# Patient Record
Sex: Male | Born: 1990 | Race: White | Hispanic: No | Marital: Single
Health system: Southern US, Community
[De-identification: ages and names within clinical notes are randomized; demographics above are authoritative.]

---
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
## 2010-10-26 IMAGING — CR DG CHEST 2V
2 series · 2 of 2 positions shown · non-contrast
Comparison: None.

CLINICAL DATA: Cough and sore throat.

CHEST - 2 VIEW

[view not recorded (1 of 2)]
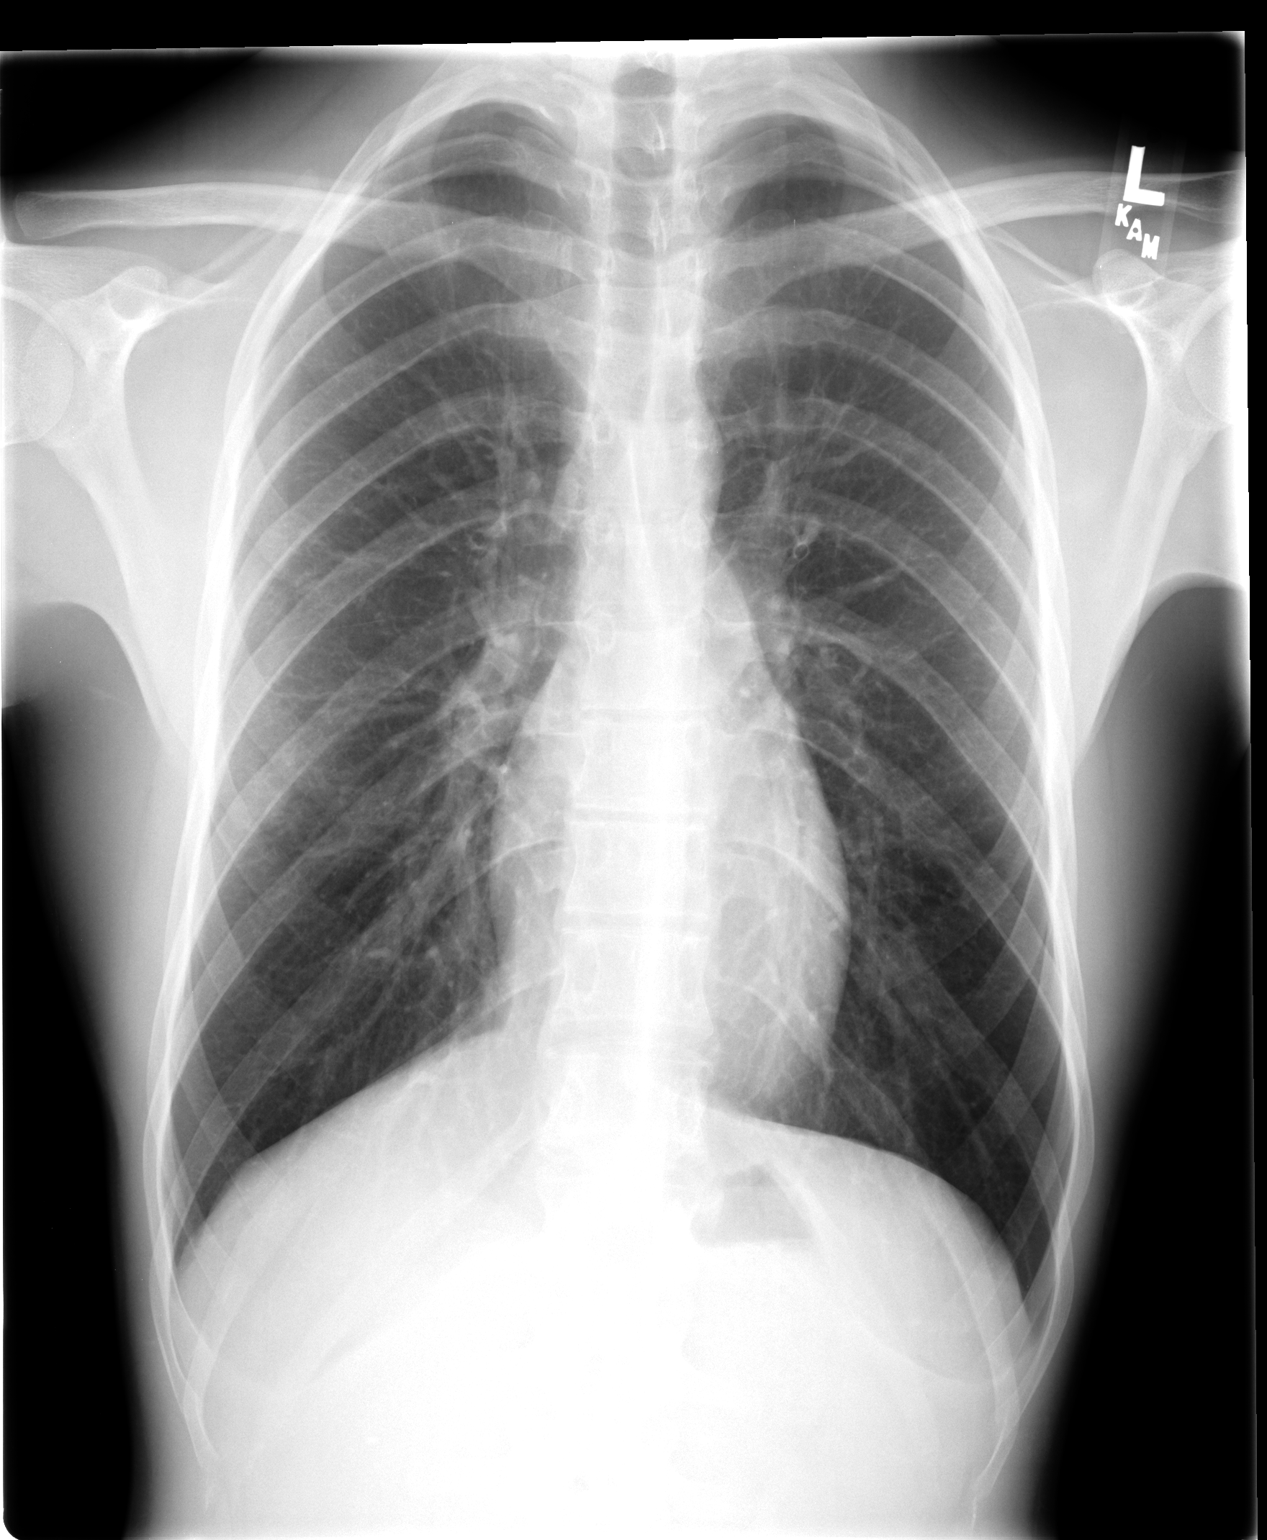

[view not recorded (2 of 2)]
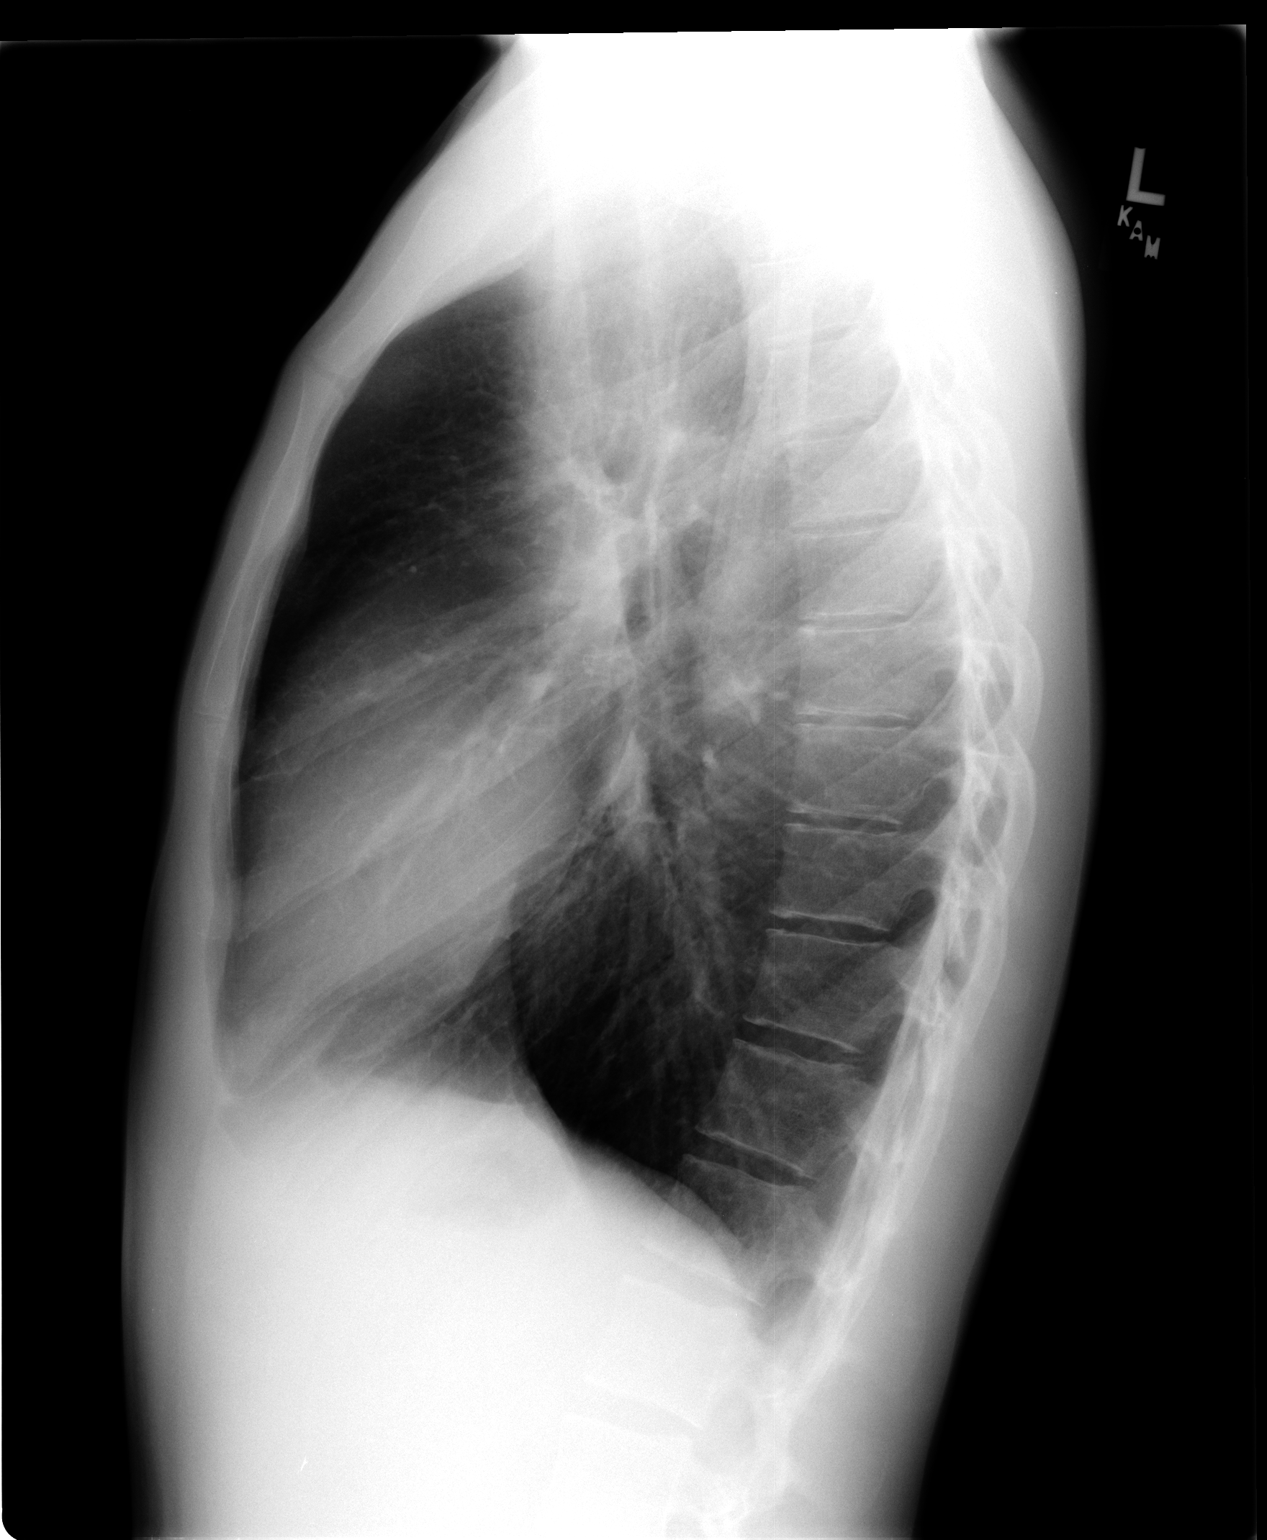

[2 of 2 positions shown; findings below may reference images not displayed]

FINDINGS: Trachea is midline.  Heart size normal.  Lungs are clear
but mildly hyperexpanded.  No pleural fluid.
IMPRESSION: No acute findings.

## 2019-03-09 ENCOUNTER — Ambulatory Visit: Payer: Self-pay | Attending: Internal Medicine

## 2019-03-09 DIAGNOSIS — Z20822 Contact with and (suspected) exposure to covid-19: Secondary | ICD-10-CM | POA: Insufficient documentation

## 2019-03-11 LAB — NOVEL CORONAVIRUS, NAA: SARS-CoV-2, NAA: NOT DETECTED

## 2021-12-21 ENCOUNTER — Other Ambulatory Visit: Payer: Self-pay

## 2021-12-21 ENCOUNTER — Emergency Department (HOSPITAL_COMMUNITY)
Admission: EM | Admit: 2021-12-21 | Discharge: 2021-12-22 | Disposition: A | Discharge status: Home / Self Care | Payer: BC Managed Care – PPO | Attending: Emergency Medicine | Admitting: Emergency Medicine

## 2021-12-21 DIAGNOSIS — F329 Major depressive disorder, single episode, unspecified: Secondary | ICD-10-CM | POA: Insufficient documentation

## 2021-12-21 DIAGNOSIS — F12959 Cannabis use, unspecified with psychotic disorder, unspecified: Secondary | ICD-10-CM | POA: Insufficient documentation

## 2021-12-21 DIAGNOSIS — I10 Essential (primary) hypertension: Secondary | ICD-10-CM | POA: Diagnosis not present

## 2021-12-21 DIAGNOSIS — R000 Tachycardia, unspecified: Secondary | ICD-10-CM | POA: Insufficient documentation

## 2021-12-21 DIAGNOSIS — Y900 Blood alcohol level of less than 20 mg/100 ml: Secondary | ICD-10-CM | POA: Insufficient documentation

## 2021-12-21 DIAGNOSIS — T50991A Poisoning by other drugs, medicaments and biological substances, accidental (unintentional), initial encounter: Secondary | ICD-10-CM | POA: Insufficient documentation

## 2021-12-21 DIAGNOSIS — T6591XA Toxic effect of unspecified substance, accidental (unintentional), initial encounter: Secondary | ICD-10-CM

## 2021-12-21 DIAGNOSIS — F419 Anxiety disorder, unspecified: Secondary | ICD-10-CM | POA: Insufficient documentation

## 2021-12-21 DIAGNOSIS — R45851 Suicidal ideations: Secondary | ICD-10-CM | POA: Insufficient documentation

## 2021-12-21 DIAGNOSIS — Z046 Encounter for general psychiatric examination, requested by authority: Secondary | ICD-10-CM | POA: Diagnosis present

## 2021-12-21 LAB — BASIC METABOLIC PANEL
Anion gap: 13 (ref 5–15)
BUN: 15 mg/dL (ref 6–20)
CO2: 22 mmol/L (ref 22–32)
Calcium: 9.4 mg/dL (ref 8.9–10.3)
Chloride: 103 mmol/L (ref 98–111)
Creatinine, Ser: 1.17 mg/dL (ref 0.61–1.24)
GFR, Estimated: >60 mL/min (ref >60)
Glucose, Bld: 134 mg/dL — ABNORMAL HIGH (ref 70–99)
Potassium: 3.4 mmol/L — ABNORMAL LOW (ref 3.5–5.1)
Sodium: 138 mmol/L (ref 135–145)

## 2021-12-21 LAB — ACETAMINOPHEN LEVEL: Acetaminophen (Tylenol), Serum: <10 ug/mL — ABNORMAL LOW (ref 10–30)

## 2021-12-21 LAB — CBC WITH DIFFERENTIAL/PLATELET
Abs Immature Granulocytes: 0.02 10*3/uL (ref 0.00–0.07)
Basophils Absolute: 0 10*3/uL (ref 0.0–0.1)
Basophils Relative: 0 %
Eosinophils Absolute: 0.1 10*3/uL (ref 0.0–0.5)
Eosinophils Relative: 2 %
HCT: 46.3 % (ref 39.0–52.0)
Hemoglobin: 16 g/dL (ref 13.0–17.0)
Immature Granulocytes: 0 %
Lymphocytes Relative: 46 %
Lymphs Abs: 3.5 10*3/uL (ref 0.7–4.0)
MCH: 31.3 pg (ref 26.0–34.0)
MCHC: 34.6 g/dL (ref 30.0–36.0)
MCV: 90.6 fL (ref 80.0–100.0)
Monocytes Absolute: 0.6 10*3/uL (ref 0.1–1.0)
Monocytes Relative: 8 %
Neutro Abs: 3.3 10*3/uL (ref 1.7–7.7)
Neutrophils Relative %: 44 %
Platelets: 304 10*3/uL (ref 150–400)
RBC: 5.11 MIL/uL (ref 4.22–5.81)
RDW: 11.8 % (ref 11.5–15.5)
WBC: 7.5 10*3/uL (ref 4.0–10.5)
nRBC: 0 % (ref 0.0–0.2)

## 2021-12-21 LAB — ETHANOL: Alcohol, Ethyl (B): <10 mg/dL (ref <10)

## 2021-12-21 MED ORDER — LORAZEPAM 2 MG/ML IJ SOLN
1.0000 mg | Freq: Once | INTRAMUSCULAR | Status: AC
  Administered 2021-12-21: 1 mg via INTRAVENOUS
  Filled 2021-12-21: qty 1

## 2021-12-21 MED ORDER — LACTATED RINGERS IV BOLUS
1000.0000 mL | Freq: Once | INTRAVENOUS | Status: AC
  Administered 2021-12-21: 1000 mL via INTRAVENOUS

## 2021-12-21 NOTE — ED Provider Notes (Signed)
[facility] EMERGENCY DEPARTMENT Provider Note   CSN: [id number] Arrival date & time: 12/21/21  2105     History  Chief Complaint  Patient presents with   Ingestion    Scott Hicks is a 31 y.o. male.  Scott Hicks is a 31 year old male with no past medical history who presents to the emergency department after ingesting delta 8.  The patient states that he took 25 mg of delta 8 today.  Denies any other drug use or alcohol use.  He reports that "nothing matters, everything matters."  He reports that "everything that I am thinking happens before it actually happened."  He reports that "I cheated on my wife, yeah I am a bad person."  "My tongue is on fire, I cannot stop the talking."  He then proceeds to ask "is this the sweet release of death?" as the cardiac monitor beeps. He asks "What is time?" when asked if his symptoms are started today.  He denies any recent fevers or medical concerns at this time.  Patient was tachycardic with EMS and reportedly agitated but was able to be verbally de-escalated.  He also reports depression and suicidal thoughts with attempts at running in front of cars to kill himself.  The history is provided by the EMS personnel.  Ingestion       Home Medications Prior to Admission medications   Not on File      Allergies    Patient has no known allergies.    Review of Systems   Review of Systems  See HPI.  Physical Exam Updated Vital Signs BP 126/83 (BP Location: Right Arm)   Pulse 86   Temp 98.6 F (37 C) (Oral)   Resp 16   SpO2 99%   Physical Exam Vitals and nursing note reviewed.  HENT:     Head: Normocephalic and atraumatic.     Nose: Nose normal.  Eyes:     Extraocular Movements: Extraocular movements intact.     Conjunctiva/sclera: Conjunctivae normal.     Comments: Pupils dilated, reactive bilaterally.   Cardiovascular:     Rate and Rhythm: Regular rhythm. Tachycardia present.     Pulses: Normal pulses.      Heart sounds: Normal heart sounds.  Pulmonary:     Effort: Pulmonary effort is normal.     Breath sounds: Normal breath sounds.  Abdominal:     General: There is no distension.     Palpations: Abdomen is soft.     Tenderness: There is no abdominal tenderness.  Musculoskeletal:     Cervical back: Normal range of motion.  Skin:    General: Skin is warm and dry.  Neurological:     General: No focal deficit present.     Mental Status: He is alert.  Psychiatric:     Comments: Anxious. Demonstrating strange and paranoid thoughts.      ED Results / Procedures / Treatments   Labs (all labs ordered are listed, but only abnormal results are displayed) Labs Reviewed  RAPID URINE DRUG SCREEN, HOSP PERFORMED - Abnormal; Notable for the following components:      Result Value   Tetrahydrocannabinol POSITIVE (*)    All other components within normal limits  URINALYSIS, ROUTINE W REFLEX MICROSCOPIC - Abnormal; Notable for the following components:   APPearance HAZY (*)    Protein, ur 30 (*)    Bacteria, UA RARE (*)    All other components within normal limits  BASIC METABOLIC PANEL -  Abnormal; Notable for the following components:   Potassium 3.4 (*)    Glucose, Bld 134 (*)    All other components within normal limits  ACETAMINOPHEN LEVEL - Abnormal; Notable for the following components:   Acetaminophen (Tylenol), Serum <10 (*)    All other components within normal limits  RESP PANEL BY RT-PCR (FLU A&B, COVID) ARPGX2  CBC WITH DIFFERENTIAL/PLATELET  ETHANOL    EKG EKG Interpretation  Date/Time:  Tuesday December 22 2021 00:15:32 EDT Ventricular Rate:  135 PR Interval:  128 QRS Duration: 81 QT Interval:  297 QTC Calculation: 446 R Axis:   68 Text Interpretation: Sinus tachycardia Borderline repolarization abnormality No old tracing to compare Confirmed by Delora Fuel ([id number]) on 12/22/2021 4:40:45 AM  Radiology No results found.  Procedures Procedures    Medications  Ordered in ED Medications  LORazepam (ATIVAN) injection 1 mg (1 mg Intravenous Given 12/21/21 2154)  lactated ringers bolus 1,000 mL (0 mLs Intravenous Stopped 12/22/21 0235)  LORazepam (ATIVAN) injection 1 mg (1 mg Intravenous Given 12/21/21 2308)  LORazepam (ATIVAN) injection 1 mg (1 mg Intravenous Given 12/22/21 0542)    ED Course/ Medical Decision Making/ A&P Clinical Course as of 12/22/21 2012  Mon Dec 21, 2021  2327 Delta 8 ingestion for first time. Tachy, anxious, suicidal running in front of cars.  [DG]  Tue Dec 22, 2021  3151 TTS consultation for SI per family [CC]    Clinical Course User Index [CC] Tretha Sciara, MD [DG] Renard Matter, MD                           Medical Decision Making Problems Addressed: Ingestion of substance, accidental or unintentional, initial encounter: complicated acute illness or injury that poses a threat to life or bodily functions  Amount and/or Complexity of Data Reviewed Labs: ordered. Decision-making details documented in ED Course. ECG/medicine tests: ordered and independent interpretation performed. Decision-making details documented in ED Course. Discussion of management or test interpretation with external provider(s): Psychiatry   Risk Prescription drug management.   Scott Hicks is a 31 year old male with no past medical history who presents to the emergency department after ingesting delta 8.  The patient states that he took 25 mg of delta 8 today.   Patient initially tachycardic, appears anxious, hypertensive.  Patient given 1 mg IV Ativan with improvement though tachycardia resolved sometime after. He was given a second dose of 1 mg IV Ativan and started on IVFs.   CBC, BMP unremarkable. EKG shows sinus tachycardia on my independent review. UDS + for THC only. UA without evidence of UTI. Ethanol level negative. Acetaminophen level negative, and patient denies any other co-ingestions.   Feel patient is having acute  stress reaction precipitated by 12-8 ingestion as well as recent social stressors versus new onset psychosis.  Feel patient will require psychiatry evaluation and likely voluntary commitment, as he is currently agreeing to inpatient care, unless symptoms resolve with further metabolization and are deemed to be substance induced only. No focal neurologic deficits on exam to suggest acute intracranial pathology and no history of recent trauma. No fever or infectious symptoms reported or visible on exam to suggest underlying infection. Will plan for serial reassessments for metabolization and psychiatric evaluation.   At time of handoff, patient requires further metabolization prior to psychiatric consultation.  Please see oncoming provider's note for further details of continued ED course.        Final Clinical Impression(s) /  ED Diagnoses Final diagnoses:  Ingestion of substance, accidental or unintentional, initial encounter  Tachycardia    Rx / DC Orders ED Discharge Orders     None         Stephanie Coup, MD 12/22/21 2012    Alvira Monday, MD 12/23/21 2308

## 2021-12-21 NOTE — ED Triage Notes (Addendum)
Pt brought to ED by [facility] for bizarre behavior after ingesting Delta 8- 25mg  this evening.   20g LAC  EMS Vitals BP 158/84 HR 126 SPO2 99% RA CBG 106

## 2021-12-21 NOTE — ED Notes (Signed)
Wife Noell Shular [phone] would like an update asap

## 2021-12-22 DIAGNOSIS — F12959 Cannabis use, unspecified with psychotic disorder, unspecified: Secondary | ICD-10-CM

## 2021-12-22 LAB — RAPID URINE DRUG SCREEN, HOSP PERFORMED
Amphetamines: NOT DETECTED
Barbiturates: NOT DETECTED
Benzodiazepines: NOT DETECTED
Cocaine: NOT DETECTED
Opiates: NOT DETECTED
Tetrahydrocannabinol: POSITIVE — AB

## 2021-12-22 LAB — URINALYSIS, ROUTINE W REFLEX MICROSCOPIC
Bilirubin Urine: NEGATIVE
Glucose, UA: NEGATIVE mg/dL
Hgb urine dipstick: NEGATIVE
Ketones, ur: NEGATIVE mg/dL
Leukocytes,Ua: NEGATIVE
Nitrite: NEGATIVE
Protein, ur: 30 mg/dL — AB
Specific Gravity, Urine: 1.025 (ref 1.005–1.030)
pH: 6 (ref 5.0–8.0)

## 2021-12-22 MED ORDER — LORAZEPAM 2 MG/ML IJ SOLN
1.0000 mg | Freq: Once | INTRAMUSCULAR | Status: AC
  Administered 2021-12-22: 1 mg via INTRAVENOUS
  Filled 2021-12-22: qty 1

## 2021-12-22 NOTE — ED Provider Notes (Signed)
Care of patient taken over from prior provider.  Patient's wife and family member at bedside. Patient evaluated by psychiatric team this morning and they feel comfortable with discharge and outpatient care and management.  Patient discharged with no further acute events ambulatory tolerating p.o. intake.   Tretha Sciara, MD 12/22/21 1014

## 2021-12-22 NOTE — Consult Note (Signed)
BH ED ASSESSMENT   Reason for Consult:  psychosis Referring Physician:  Dr. 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 male patient who was brought to the ED by EMS for bizarre behaviors after ingesting delta 825 mg last night.  He reported feeling disorganized, hopeless, difficulty distinguishing what is real and what is not real, and had vague suicidal ideations while intoxicated.  Patient has no psychiatric history.  Subjective:   Patient seen this morning at [facility], ED for face-to-face evaluation.  His wife, Scott Hicks, is in the room and he requests that she stays during the assessment.  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  He has been feeling a lot of stress at work recently and decided to try delta 8 Gummies as a form of stress relief.  He tried a quarter of a gummy 2 separate times after eating meals and never felt any different.  Yesterday he took half of a gummy on an empty stomach and felt like his reality was being altered about 45 minutes later.  Patient reported feeling paranoid, confused on reality and was not sure what was real and what was not real, and started experiencing some depression and hopelessness.  He did mention he had an affair on his wife and she recently found out which is an added stress.  He feels like while he was intoxicated the stress of his marriage, job, and the current feelings he was having "felt like hell" and he felt like the only release would be to die.  He assures me he is having no suicidal ideations today.  Pt stated, "I love my life. I want to live I do not want to die." He has no previous suicide  attempts.  No previous psychiatric history, he denies any depression or anxiety prior to this episode.  He denies any homicidal ideations.  He denies any auditory or visual hallucinations.  He reports sleeping well, on average he gets 6 to 8 hours of sleep per night.  No problems with appetite.  He does currently see a therapist, has been seeing her for a few months now.    His wife, Scott Hicks, mentions multiple years ago he did consume a weed brownie and had a similar reaction where he was extremely anxious, paranoid, and thought his taste buds were alive and trying to attack him.  She tells me they did not know THC was in delta 8 Gummies until now.  Scott Hicks also stated he would have never of taken the delta 8 Gummies if he knew there was a THC percentage in them.  Scott Hicks feels comfortable with the patient coming home today.  She feels like he has cleared up and return to his baseline.  Patient is also requesting to go home.  He is able to contract for safety and tells me he has no plans of ever using marijuana or any delta 8/9 products again.  Patient is able to engage in coherent and logical conversation.  Does not appear to be responding to internal stimuli, no concerns for psychosis/mania at this time.  His speech is normal in rate and tone.  Patient has no questions or  concerns at this time.  He reported feeling very anxious this morning, but he is now feeling much better and like himself.  I did counsel the patient about how important it is to refrain from any illicit substances including the synthetic Gummies as it is clear his body does not react well to it.  He is agreeable to discontinue.  We also spoke about different resources in the community he can utilize such as behavioral health urgent care.  Will psychiatrically clear patient.  Past Psychiatric History:  none  Risk to Self or Others: Is the patient at risk to self? No Has the patient been a risk to self in the past 6 months? No Has the patient  been a risk to self within the distant past? No Is the patient a risk to others? No Has the patient been a risk to others in the past 6 months? No Has the patient been a risk to others within the distant past? No  [place] Scale:  Flowsheet Row ED from 12/21/2021 in [facility] EMERGENCY DEPARTMENT  C-SSRS RISK CATEGORY High Risk      Substance Abuse:  Alcohol / Drug Use Pain Medications: see MAR Prescriptions: see MAR Over the Counter: see MAR History of alcohol / drug use?: Yes Longest period of sobriety (when/how long): Delta 8, 3 times  Past Medical History: No past medical history on file.  Family History: No family history on file.  Social History:  Social History   Substance and Sexual Activity  Alcohol Use Not on file     Social History   Substance and Sexual Activity  Drug Use Not on file    Social History   Socioeconomic History   Marital status: Single    Spouse name: Not on file   Number of children: Not on file   Years of education: Not on file   Highest education level: Not on file  Occupational History   Not on file  Tobacco Use   Smoking status: Not on file   Smokeless tobacco: Not on file  Substance and Sexual Activity   Alcohol use: Not on file   Drug use: Not on file   Sexual activity: Not on file  Other Topics Concern   Not on file  Social History Narrative   Not on file   Social Determinants of Health   Financial Resource Strain: Not on file  Food Insecurity: Not on file  Transportation Needs: Not on file  Physical Activity: Not on file  Stress: Not on file  Social Connections: Not on file   Additional Social History:    Allergies:  No Known Allergies  Labs:  Results for orders placed or performed during the hospital encounter of 12/21/21 (from the past 48 hour(s))  CBC with Differential     Status: None   Collection Time: 12/21/21  9:15 PM  Result Value Ref Range   WBC 7.5 4.0 - 10.5 K/uL   RBC 5.11 4.22  - 5.81 MIL/uL   Hemoglobin 16.0 13.0 - 17.0 g/dL   HCT 47.6 54.6 - 50.3 %   MCV 90.6 80.0 - 100.0 fL   MCH 31.3 26.0 - 34.0 pg   MCHC 34.6 30.0 - 36.0 g/dL   RDW 54.6 56.8 - 12.7 %   Platelets 304 150 - 400 K/uL   nRBC 0.0 0.0 - 0.2 %   Neutrophils Relative % 44 %   Neutro Abs 3.3 1.7 - 7.7 K/uL   Lymphocytes Relative  46 %   Lymphs Abs 3.5 0.7 - 4.0 K/uL   Monocytes Relative 8 %   Monocytes Absolute 0.6 0.1 - 1.0 K/uL   Eosinophils Relative 2 %   Eosinophils Absolute 0.1 0.0 - 0.5 K/uL   Basophils Relative 0 %   Basophils Absolute 0.0 0.0 - 0.1 K/uL   Immature Granulocytes 0 %   Abs Immature Granulocytes 0.02 0.00 - 0.07 K/uL    Comment: Performed at [facility], [address] [place], [place] [address]  Basic metabolic panel     Status: Abnormal   Collection Time: 12/21/21  9:15 PM  Result Value Ref Range   Sodium 138 135 - 145 mmol/L   Potassium 3.4 (L) 3.5 - 5.1 mmol/L    Comment: HEMOLYSIS AT THIS LEVEL MAY AFFECT RESULT   Chloride 103 98 - 111 mmol/L   CO2 22 22 - 32 mmol/L   Glucose, Bld 134 (H) 70 - 99 mg/dL    Comment: Glucose reference range applies only to samples taken after fasting for at least 8 hours.   BUN 15 6 - 20 mg/dL   Creatinine, Ser 1.911.17 0.61 - 1.24 mg/dL   Calcium 9.4 8.9 - 47.810.3 mg/dL   GFR, Estimated >29>60 >56>60 mL/min    Comment: (NOTE) Calculated using the CKD-EPI Creatinine Equation (2021)    Anion gap 13 5 - 15    Comment: Performed at [facility], [address] [place], [place] [address]  Acetaminophen level     Status: Abnormal   Collection Time: 12/21/21  9:15 PM  Result Value Ref Range   Acetaminophen (Tylenol), Serum <10 (L) 10 - 30 ug/mL    Comment: (NOTE) Therapeutic concentrations vary significantly. A range of 10-30 ug/mL  may be an effective concentration for many patients. However, some  are best treated at concentrations outside of this range. Acetaminophen concentrations >150 ug/mL at 4 hours after ingestion  and  >50 ug/mL at 12 hours after ingestion are often associated with  toxic reactions.  Performed at [facility], [address] [place], [place] [address]   Ethanol     Status: None   Collection Time: 12/21/21  9:15 PM  Result Value Ref Range   Alcohol, Ethyl (B) <10 <10 mg/dL    Comment: (NOTE) Lowest detectable limit for serum alcohol is 10 mg/dL.  For medical purposes only. Performed at [facility], [address] [place], [place] [address]   Rapid urine drug screen (hospital performed)     Status: Abnormal   Collection Time: 12/22/21  1:00 AM  Result Value Ref Range   Opiates NONE DETECTED NONE DETECTED   Cocaine NONE DETECTED NONE DETECTED   Benzodiazepines NONE DETECTED NONE DETECTED   Amphetamines NONE DETECTED NONE DETECTED   Tetrahydrocannabinol POSITIVE (A) NONE DETECTED   Barbiturates NONE DETECTED NONE DETECTED    Comment: (NOTE) DRUG SCREEN FOR MEDICAL PURPOSES ONLY.  IF CONFIRMATION IS NEEDED FOR ANY PURPOSE, NOTIFY LAB WITHIN 5 DAYS.  LOWEST DETECTABLE LIMITS FOR URINE DRUG SCREEN Drug Class                     Cutoff (ng/mL) Amphetamine and metabolites    1000 Barbiturate and metabolites    200 Benzodiazepine                 200 Opiates and metabolites        300 Cocaine and metabolites  300 THC                            50 Performed at [facility], [address] [place], [place] [address]   Urinalysis, Routine w reflex microscopic     Status: Abnormal   Collection Time: 12/22/21  1:00 AM  Result Value Ref Range   Color, Urine YELLOW YELLOW   APPearance HAZY (A) CLEAR   Specific Gravity, Urine 1.025 1.005 - 1.030   pH 6.0 5.0 - 8.0   Glucose, UA NEGATIVE NEGATIVE mg/dL   Hgb urine dipstick NEGATIVE NEGATIVE   Bilirubin Urine NEGATIVE NEGATIVE   Ketones, ur NEGATIVE NEGATIVE mg/dL   Protein, ur 30 (A) NEGATIVE mg/dL   Nitrite NEGATIVE NEGATIVE   Leukocytes,Ua NEGATIVE NEGATIVE   RBC / HPF 0-5 0 - 5 RBC/hpf   WBC, UA  0-5 0 - 5 WBC/hpf   Bacteria, UA RARE (A) NONE SEEN   Mucus PRESENT     Comment: Performed at [facility], [address] [place], [place] [address]    No current facility-administered medications for this encounter.   No current outpatient medications on file.   Psychiatric Specialty Exam: Presentation  General Appearance:  Appropriate for Environment  Eye Contact: Good  Speech: Clear and Coherent  Speech Volume: Normal  Handedness:No data recorded  Mood and Affect  Mood: Euthymic  Affect: Congruent   Thought Process  Thought Processes: Coherent  Descriptions of Associations:Intact  Orientation:Full (Time, Place and Person)  Thought Content:Logical  History of Schizophrenia/Schizoaffective disorder:No data recorded Duration of Psychotic Symptoms:N/A  Hallucinations:Hallucinations: None  Ideas of Reference:None  Suicidal Thoughts:Suicidal Thoughts: No  Homicidal Thoughts:Homicidal Thoughts: No   Sensorium  Memory: Immediate Good; Recent Good  Judgment: Good  Insight: Good   Executive Functions  Concentration: Good  Attention Span: Good  Recall: Good  Fund of Knowledge: Good  Language: Good   Psychomotor Activity  Psychomotor Activity: Psychomotor Activity: Normal   Assets  Assets: Communication Skills; Physical Health; Resilience; Social Support    Sleep  Sleep: Sleep: Good   Physical Exam: Physical Exam Neurological:     Mental Status: He is alert and oriented to person, place, and time.  Psychiatric:        Attention and Perception: Attention normal.        Mood and Affect: Mood normal.        Speech: Speech normal.        Behavior: Behavior is cooperative.        Thought Content: Thought content normal.        Cognition and Memory: Cognition normal.        Judgment: Judgment normal.    Review of Systems  Psychiatric/Behavioral:  Positive for substance abuse.   All other systems reviewed and  are negative.  Blood pressure 126/83, pulse 86, temperature 98.6 F (37 C), temperature source Oral, resp. rate 16, SpO2 99 %. There is no height or weight on file to calculate BMI.  Medical Decision Making: Patient case reviewed and discussed with Dr. Lucianne Muss.  Patient does not meet criteria for IVC or inpatient psychiatric treatment.  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  He and his wife feel safe discharging home, he feels like he has returned to his baseline.  Will psychiatrically clear patient.   Disposition: No evidence of imminent risk to self or others at present.  Patient does not meet criteria for psychiatric inpatient admission. Supportive therapy provided about ongoing stressors. Discussed crisis plan, support from social network, calling 911, coming to the Emergency Department, and calling Suicide Hotline.  Vesta Mixer, NP 12/22/2021 10:09 AM

## 2021-12-22 NOTE — ED Notes (Signed)
Pt noted to be walking around in dept. Pt reports eating breakfast. Pt wife called to visit with pt. Spoke to pt at this time and pt verbalizes wanting to see his wife. MD aware. Orders obtained.

## 2021-12-22 NOTE — BH Assessment (Signed)
Comprehensive Clinical Assessment (CCA) Note  12/22/2021 Scott Hicks [id number]  Disposition: Rockney Ghee, NP, recommends observation for safety and stabilization with psych reassessment in the AM. Foye Clock, RN, informed of disposition.  The patient demonstrates the following risk factors for suicide: Chronic risk factors for suicide include: N/A. Acute risk factors for suicide include: family or marital conflict. Protective factors for this patient include: positive social support, positive therapeutic relationship, responsibility to others (children, family), coping skills, and hope for the future. Considering these factors, the overall suicide risk at this point appears to be moderate. Patient is not appropriate for outpatient follow up.  Flowsheet Row ED from 12/21/2021 in [facility] EMERGENCY DEPARTMENT  C-SSRS RISK CATEGORY High Risk      Scott Hicks is a 31 year old male presenting voluntary to [facility] due to ingestion of Delta 8- 25mg  last night and bizarre behaviors, including running in front of cars in the street trying to kill himself. Patient has no past medical history. Patient currently denies SI, HI, psychosis and alcohol usage.   Patient reported having a bad reaction to Delta 8 gummy's. Patient reported mood resulted "in a complete disarray of hopelessness". Patient reported "I feel 75 percent back to myself, I am able distinguish between what's real and what's not real, I know that you are real, this room is real". Patient reported "I feel I have a closer grasp on reality". Patient reported this was the 3rd time he has used Delta 8 gummy's and that they bought a different brand this time. Patient reported a couple of days first using "I tried 2 separate times a quarter on a full stomach, I didn't feel anything, so the next day I tried a half on an empty stomach and my brain reacted, everything started seeming pointless". Patient reported stressors include,  work-related stressors of high expectations and having a 3 year long affair on his wife that just came out. Patient reported having an affair on his wife was the biggest regret he has ever made. Patient reported feeling alone and detached in the marriage and that they were not communicating to each other. Patient reported worsening depressive symptoms. Patient denied prior psych hospitalizations, suicide attempts and self-harming behaviors. Patient reported 5-7 hours sleep and normal appetite.   Patient has been seeing a therapist due to life stressors for the past 1.5 years. Patient denied seeing a psychiatrist. Patient denied being prescribed psych medications.   Patient resides with wife. Patient is currently employed and reports stress on the job. Patient denied access to guns. Patient was calm and cooperative during assessment. Patient was very concerned about earlier behaviors, as he could vaguely remember them.   PER [facility] EDP 12/21/21 NOTE: He reports that "nothing matters, everything matters."  He reports that "everything that I am thinking happens before it actually happened."  He reports that "I cheated on my wife, yeah I am a bad person."  "My tongue is on fire, I cannot stop the talking."  He then proceeds to ask "is this the sweet release of death?" as the cardiac monitor beeps. He asks "What is time?" when asked if his symptoms are started today.  He denies any recent fevers or medical concerns at this time.  Patient was tachycardic with EMS and reportedly agitated but was able to be verbally de-escalated.  He also reports depression and suicidal thoughts with attempts at running in front of cars to kill himself.  Chief Complaint:  Chief Complaint  Patient  presents with   Ingestion   Visit Diagnosis:  Major depressive disorder   CCA Screening, Triage and Referral (STR)  Patient Reported Information How did you hear about [place]? Family/Friend  What Is the Reason for Your Visit/Call  Today? Ingestion of Delta 8 and bizarre behaviors.  How Long Has This Been Causing You Problems? <Week  What Do You Feel Would Help You the Most Today? Alcohol or Drug Use Treatment   Have You Recently Had Any Thoughts About Hurting Yourself? Yes  Are You Planning to Commit Suicide/Harm Yourself At This time? No   Have you Recently Had Thoughts About Hurting Someone Karolee Ohs? No  Are You Planning to Harm Someone at This Time? No  Explanation: No data recorded  Have You Used Any Alcohol or Drugs in the Past 24 Hours? Yes  How Long Ago Did You Use Drugs or Alcohol? No data recorded What Did You Use and How Much? Delta 8 gummy   Do You Currently Have a Therapist/Psychiatrist? Yes  Name of Therapist/Psychiatrist: therapist   Have You Been Recently Discharged From Any Office Practice or Programs? No  Explanation of Discharge From Practice/Program: No data recorded    CCA Screening Triage Referral Assessment Type of Contact: Tele-Assessment  Telemedicine Service Delivery:   Is this Initial or Reassessment? Initial Assessment  Date Telepsych consult ordered in [facility]:  12/22/21  Time Telepsych consult ordered in [facility]:  0105  Location of Assessment: [facility] ED  Provider Location: [facility] Assessment Services   Collateral Involvement: Maciej Schweitzer, wife, [phone]   Does Patient Have a Court Appointed Legal Guardian? No  Legal Guardian Contact Information: No data recorded Copy of Legal Guardianship Form: No data recorded Legal Guardian Notified of Arrival: No data recorded Legal Guardian Notified of Pending Discharge: No data recorded If Minor and Not Living with Parent(s), Who has Custody? No data recorded Is CPS involved or ever been involved? Never  Is APS involved or ever been involved? Never   Patient Determined To Be At Risk for Harm To Self or Others Based on Review of Patient Reported Information or Presenting Complaint? No data recorded Method: No data  recorded Availability of Means: No data recorded Intent: No data recorded Notification Required: No data recorded Additional Information for Danger to Others Potential: No data recorded Additional Comments for Danger to Others Potential: No data recorded Are There Guns or Other Weapons in Your Home? No data recorded Types of Guns/Weapons: No data recorded Are These Weapons Safely Secured?                            No data recorded Who Could Verify You Are Able To Have These Secured: No data recorded Do You Have any Outstanding Charges, Pending Court Dates, Parole/Probation? No data recorded Contacted To Inform of Risk of Harm To Self or Others: No data recorded   Does Patient Present under Involuntary Commitment? No  IVC Papers Initial File Date: No data recorded  [place] of Residence: [place]   Patient Currently Receiving the Following Services: Individual Therapy   Determination of Need: Urgent (48 hours)   Options For Referral: Outpatient Therapy; Medication Management     CCA Biopsychosocial Patient Reported Schizophrenia/Schizoaffective Diagnosis in Past: No data recorded  Strengths: self-awareness   Mental Health Symptoms Depression:   Hopelessness; Worthlessness; Change in energy/activity; Fatigue   Duration of Depressive symptoms:  Duration of Depressive Symptoms: Greater than two weeks   Mania:  None   Anxiety:    Worrying; Tension; None   Psychosis:   Delusions   Duration of Psychotic symptoms:  Duration of Psychotic Symptoms: -- (today)   Trauma:   None   Obsessions:   None   Compulsions:   None   Inattention:   None   Hyperactivity/Impulsivity:   None   Oppositional/Defiant Behaviors:   None   Emotional Irregularity:   None   Other Mood/Personality Symptoms:  No data recorded   Mental Status Exam Appearance and self-care  Stature:   Average   Weight:   Average weight   Clothing:   Neat/clean   Grooming:    Normal   Cosmetic use:   Age appropriate   Posture/gait:   Normal   Motor activity:   Not Remarkable   Sensorium  Attention:   Normal   Concentration:   Normal   Orientation:   X5   Recall/memory:   Normal   Affect and Mood  Affect:   Appropriate   Mood:   Depressed   Relating  Eye contact:   Normal   Facial expression:   Depressed   Attitude toward examiner:   Cooperative   Thought and Language  Speech flow:  Clear and Coherent   Thought content:   Appropriate to Mood and Circumstances   Preoccupation:   None   Hallucinations:   None   Organization:   Coherent   [employer] of Knowledge:   Average   Intelligence:   Average   Abstraction:   Normal   Judgement:   Fair   Dance movement psychotherapist:   Adequate   Insight:   Fair   Decision Making:   Confused   Social Functioning  Social Maturity:   Responsible Industrial/product designer)   Social Judgement:   Normal   Stress  Stressors:   Family conflict   Coping Ability:   Human resources officer Deficits:   Scientist, physiological; Self-control   Supports:   Family; Friends/Service system     Religion: Religion/Spirituality Are You A Religious Person?:  Industrial/product designer)  Leisure/Recreation: Leisure / Recreation Do You Have Hobbies?: Yes Leisure and Hobbies: musician and magic  Exercise/Diet: Exercise/Diet Do You Exercise?: No Have You Gained or Lost A Significant Amount of Weight in the Past Six Months?: No Do You Follow a Special Diet?: No Do You Have Any Trouble Sleeping?: No   CCA Employment/Education Employment/Work Situation: Employment / Work Situation Employment Situation: Employed Work Stressors: Sales executive Job has Been Impacted by Current Illness: Yes Describe how Patient's Job has Been Impacted: pressures  Education: Education Is Patient Currently Attending School?: No Last Grade Completed: 12 Did You Product manager?: Yes What Type of College Degree Do you  Have?: BS Did You Have An Individualized Education Program (IIEP): No Did You Have Any Difficulty At School?: No Patient's Education Has Been Impacted by Current Illness: No   CCA Family/Childhood History Family and Relationship History: Family history Marital status: Married Number of Years Married:  Industrial/product designer) Does patient have children?: No  Childhood History:  Childhood History By whom was/is the patient raised?: Mother Did patient suffer any verbal/emotional/physical/sexual abuse as a child?: No Did patient suffer from severe childhood neglect?: No Has patient ever been sexually abused/assaulted/raped as an adolescent or adult?: No Was the patient ever a victim of a crime or a disaster?: No  Child/Adolescent Assessment:     CCA Substance Use Alcohol/Drug Use:  ASAM's:  Six Dimensions of Multidimensional Assessment  Dimension 1:  Acute Intoxication and/or Withdrawal Potential:      Dimension 2:  Biomedical Conditions and Complications:      Dimension 3:  Emotional, Behavioral, or Cognitive Conditions and Complications:     Dimension 4:  Readiness to Change:     Dimension 5:  Relapse, Continued use, or Continued Problem Potential:     Dimension 6:  Recovery/Living Environment:     ASAM Severity Score:    ASAM Recommended Level of Treatment:     Substance use Disorder (SUD)    Recommendations for Services/Supports/Treatments:    Discharge Disposition:    DSM5 Diagnoses: There are no problems to display for this patient.    Referrals to Alternative Service(s): Referred to Alternative Service(s):   Place:   Date:   Time:    Referred to Alternative Service(s):   Place:   Date:   Time:    Referred to Alternative Service(s):   Place:   Date:   Time:    Referred to Alternative Service(s):   Place:   Date:   Time:     Venora Maples, [facility]

## 2021-12-22 NOTE — Discharge Instructions (Addendum)

## 2021-12-22 NOTE — ED Notes (Signed)
Patient states that he is feeling anxious and would like something for the anxiety.  MD notified.

## 2021-12-22 NOTE — ED Notes (Signed)
Valuables policy explained to patient and valuable pt belongings locked up with security. Valuables Envelope # [id number]   Security bag #[id number]  Contents of Envelope: 3- One hundred dollar bills 6- Twenty dollar bills 1- five dollar bill 7- one dollar bills 3- ID Cards 15- miscellaneous cards 1- cellular phone 1- car key

## 2021-12-22 NOTE — ED Notes (Signed)
Pt provided all belongings and valuables envelope from security. Pt verbalizes understanding. Pt and pt family questions answered at time of d/c. Pt left via ambulation with wife and family to POV

## 2021-12-22 NOTE — ED Notes (Signed)
TTS being done at this time.
